# Patient Record
Sex: Female | Born: 1955 | Race: White | Hispanic: No | Marital: Married | Smoking: Never smoker
Health system: Southern US, Community
[De-identification: ages and names within clinical notes are randomized; demographics above are authoritative.]

## PROBLEM LIST (undated history)

## (undated) DIAGNOSIS — G629 Polyneuropathy, unspecified: Secondary | ICD-10-CM

## (undated) DIAGNOSIS — E669 Obesity, unspecified: Secondary | ICD-10-CM

## (undated) DIAGNOSIS — M797 Fibromyalgia: Secondary | ICD-10-CM

## (undated) DIAGNOSIS — K760 Fatty (change of) liver, not elsewhere classified: Secondary | ICD-10-CM

## (undated) DIAGNOSIS — M1990 Unspecified osteoarthritis, unspecified site: Secondary | ICD-10-CM

## (undated) DIAGNOSIS — I10 Essential (primary) hypertension: Secondary | ICD-10-CM

## (undated) DIAGNOSIS — I4891 Unspecified atrial fibrillation: Secondary | ICD-10-CM

## (undated) DIAGNOSIS — I050 Rheumatic mitral stenosis: Secondary | ICD-10-CM

## (undated) DIAGNOSIS — H8109 Meniere's disease, unspecified ear: Secondary | ICD-10-CM

## (undated) DIAGNOSIS — K5190 Ulcerative colitis, unspecified, without complications: Secondary | ICD-10-CM

## (undated) DIAGNOSIS — I219 Acute myocardial infarction, unspecified: Secondary | ICD-10-CM

## (undated) DIAGNOSIS — G459 Transient cerebral ischemic attack, unspecified: Secondary | ICD-10-CM

## (undated) HISTORY — DX: Fibromyalgia: M79.7

## (undated) HISTORY — DX: Essential (primary) hypertension: I10

## (undated) HISTORY — DX: Fatty (change of) liver, not elsewhere classified: K76.0

## (undated) HISTORY — DX: Unspecified osteoarthritis, unspecified site: M19.90

## (undated) HISTORY — DX: Meniere's disease, unspecified ear: H81.09

## (undated) HISTORY — DX: Unspecified atrial fibrillation: I48.91

## (undated) HISTORY — PX: PACEMAKER PLACEMENT: SHX43

## (undated) HISTORY — DX: Rheumatic mitral stenosis: I05.0

## (undated) HISTORY — PX: PARTIAL HYSTERECTOMY: SHX80

## (undated) HISTORY — DX: Transient cerebral ischemic attack, unspecified: G45.9

## (undated) HISTORY — DX: Acute myocardial infarction, unspecified: I21.9

## (undated) HISTORY — PX: REPLACEMENT TOTAL KNEE BILATERAL: SUR1225

## (undated) HISTORY — DX: Obesity, unspecified: E66.9

## (undated) HISTORY — PX: ESOPHAGOGASTRODUODENOSCOPY: SHX1529

## (undated) HISTORY — PX: ABDOMINAL HYSTERECTOMY: SHX81

## (undated) HISTORY — PX: CHOLECYSTECTOMY: SHX55

## (undated) HISTORY — DX: Ulcerative colitis, unspecified, without complications: K51.90

## (undated) HISTORY — DX: Polyneuropathy, unspecified: G62.9

---
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
## 2011-01-23 ENCOUNTER — Emergency Department: Payer: Self-pay | Admitting: Emergency Medicine

## 2011-02-11 ENCOUNTER — Ambulatory Visit: Payer: Self-pay | Admitting: Cardiovascular Disease

## 2011-03-30 ENCOUNTER — Ambulatory Visit: Payer: Self-pay | Admitting: Vascular Surgery

## 2011-04-06 ENCOUNTER — Ambulatory Visit: Payer: Self-pay | Admitting: Internal Medicine

## 2011-04-09 ENCOUNTER — Ambulatory Visit: Payer: Self-pay | Admitting: Internal Medicine

## 2011-07-21 LAB — HM COLONOSCOPY

## 2011-08-14 ENCOUNTER — Ambulatory Visit: Payer: Self-pay | Admitting: Gastroenterology

## 2011-10-15 ENCOUNTER — Ambulatory Visit: Payer: Self-pay | Admitting: Internal Medicine

## 2012-04-25 ENCOUNTER — Ambulatory Visit: Payer: Self-pay | Admitting: Pain Medicine

## 2012-04-28 ENCOUNTER — Ambulatory Visit: Payer: Self-pay | Admitting: Pain Medicine

## 2012-04-28 ENCOUNTER — Other Ambulatory Visit: Payer: Self-pay | Admitting: Pain Medicine

## 2012-04-28 LAB — SEDIMENTATION RATE: Erythrocyte Sed Rate: 25 mm/hr (ref 0–30)

## 2012-06-22 ENCOUNTER — Ambulatory Visit: Payer: Self-pay | Admitting: Pain Medicine

## 2012-07-12 ENCOUNTER — Ambulatory Visit: Payer: Self-pay | Admitting: Pain Medicine

## 2012-07-14 ENCOUNTER — Ambulatory Visit: Payer: Self-pay | Admitting: Internal Medicine

## 2012-07-20 ENCOUNTER — Ambulatory Visit: Payer: Self-pay | Admitting: Internal Medicine

## 2012-08-15 ENCOUNTER — Ambulatory Visit: Payer: Self-pay | Admitting: Pain Medicine

## 2012-08-17 ENCOUNTER — Ambulatory Visit: Payer: Self-pay | Admitting: Gastroenterology

## 2012-09-28 ENCOUNTER — Ambulatory Visit: Payer: Self-pay | Admitting: Pain Medicine

## 2012-09-28 ENCOUNTER — Other Ambulatory Visit: Payer: Self-pay | Admitting: Pain Medicine

## 2012-09-28 LAB — BASIC METABOLIC PANEL
Anion Gap: 5 — ABNORMAL LOW (ref 7–16)
Chloride: 105 mmol/L (ref 98–107)
Co2: 29 mmol/L (ref 21–32)
Glucose: 104 mg/dL — ABNORMAL HIGH (ref 65–99)
Potassium: 4.4 mmol/L (ref 3.5–5.1)

## 2012-10-24 ENCOUNTER — Ambulatory Visit: Payer: Self-pay | Admitting: Pain Medicine

## 2012-11-30 ENCOUNTER — Ambulatory Visit: Payer: Self-pay | Admitting: Physician Assistant

## 2012-12-02 ENCOUNTER — Ambulatory Visit: Payer: Self-pay | Admitting: Pain Medicine

## 2012-12-25 IMAGING — XA IR VASCULAR PROCEDURE
3 series · 6 of 6 positions shown · IV contrast (IODINE)
Comparison: none

[Series 1: care upper arm · 2 of 2 slices shown (1 of 3)]
[im 1/2]
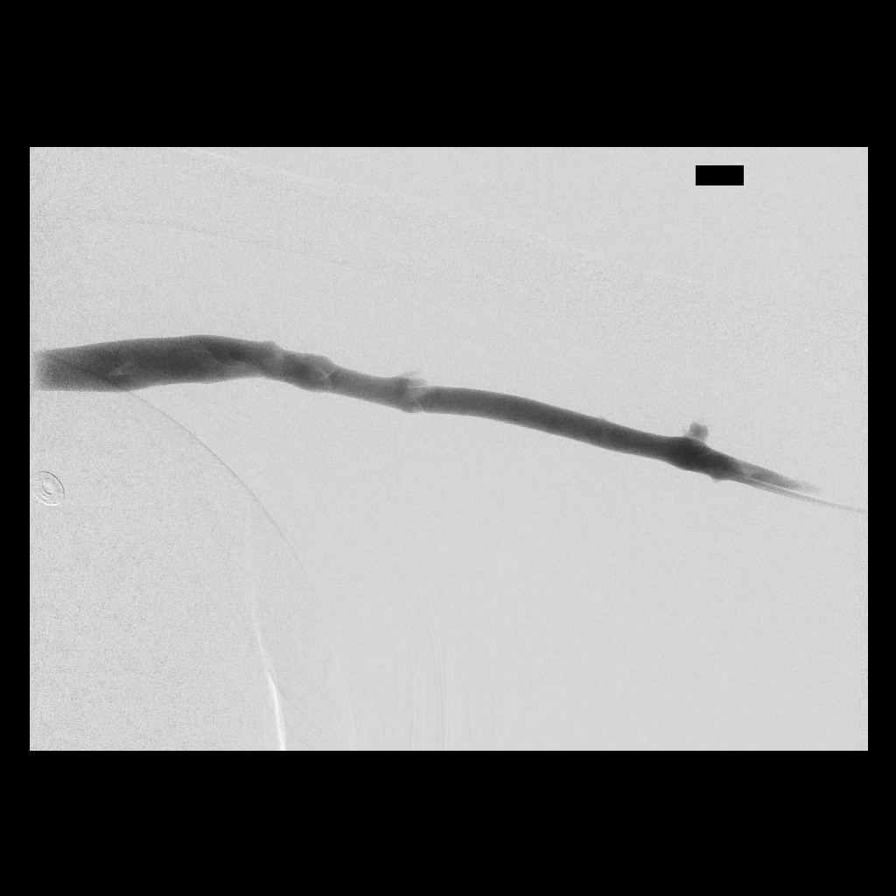
[im 2/2]
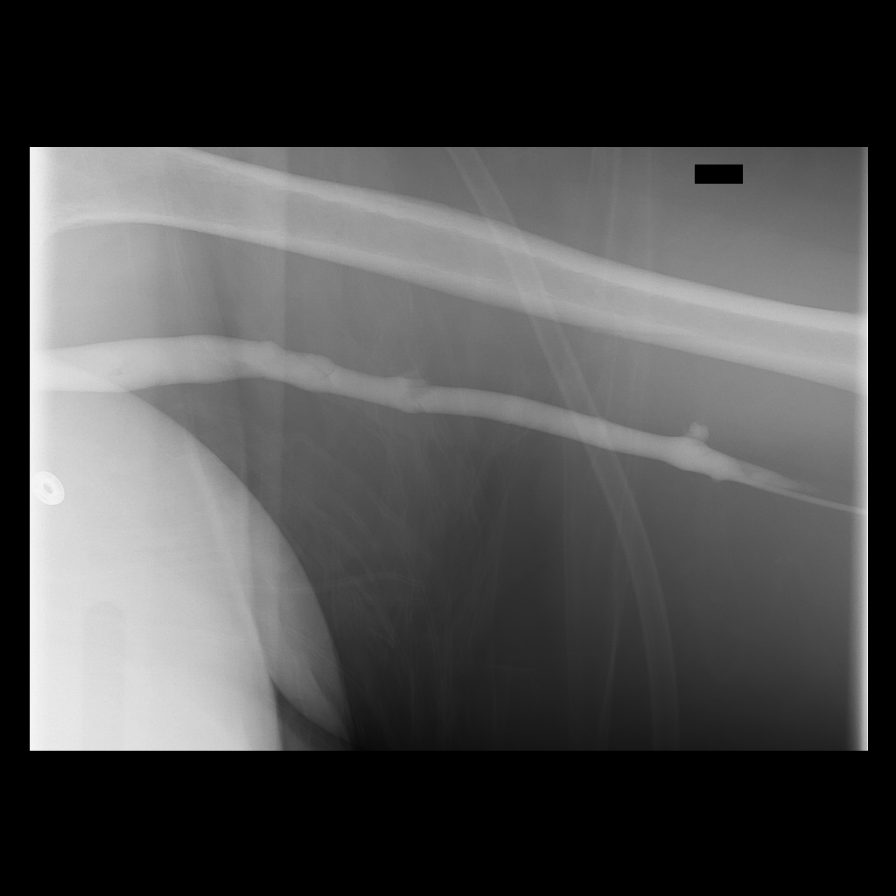

[Series 2: care upper arm · 2 of 2 slices shown (2 of 3)]
[im 1/2]
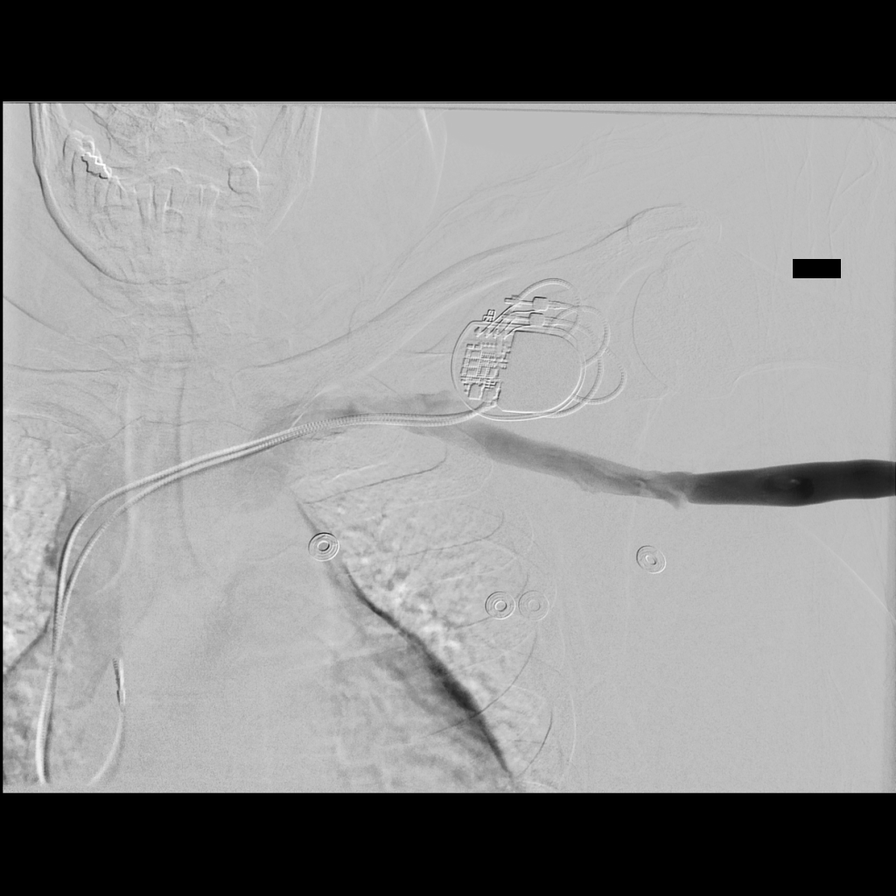
[im 2/2]
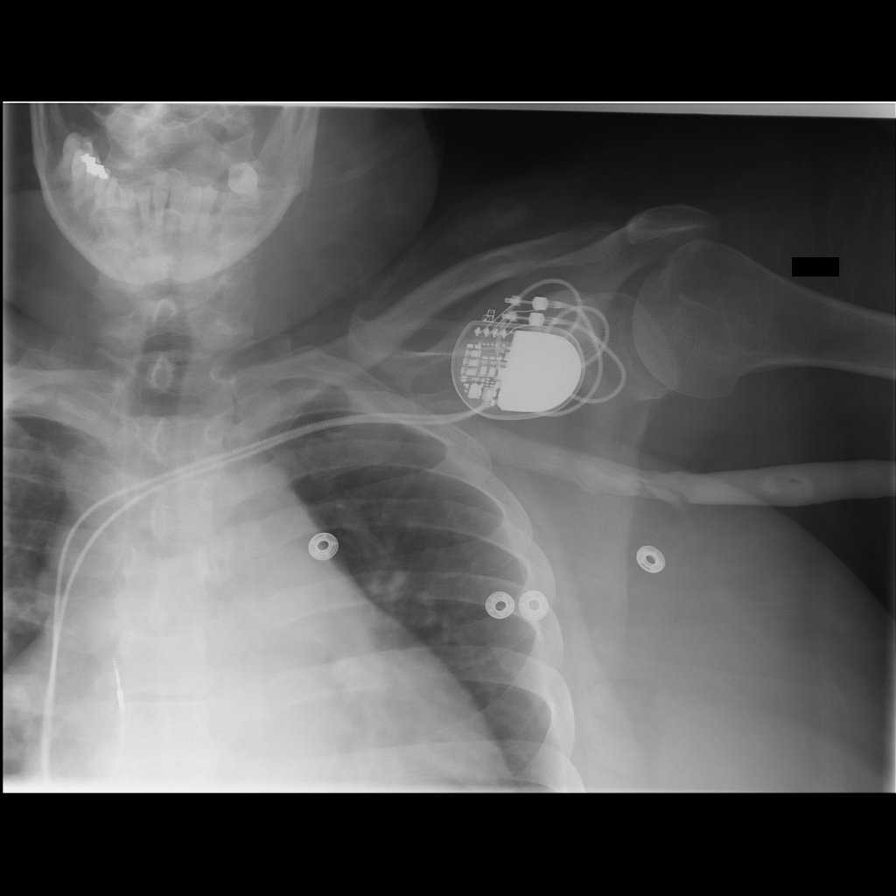

[Series 3: care upper arm · 2 of 2 slices shown (3 of 3)]
[im 1/2]
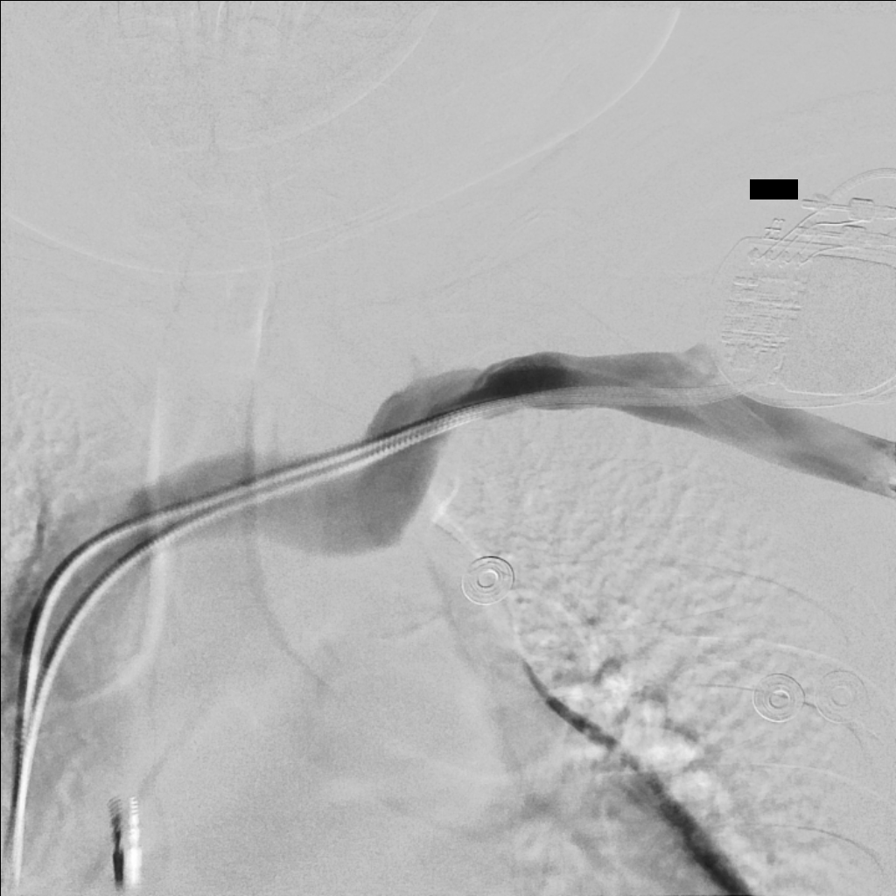
[im 2/2]
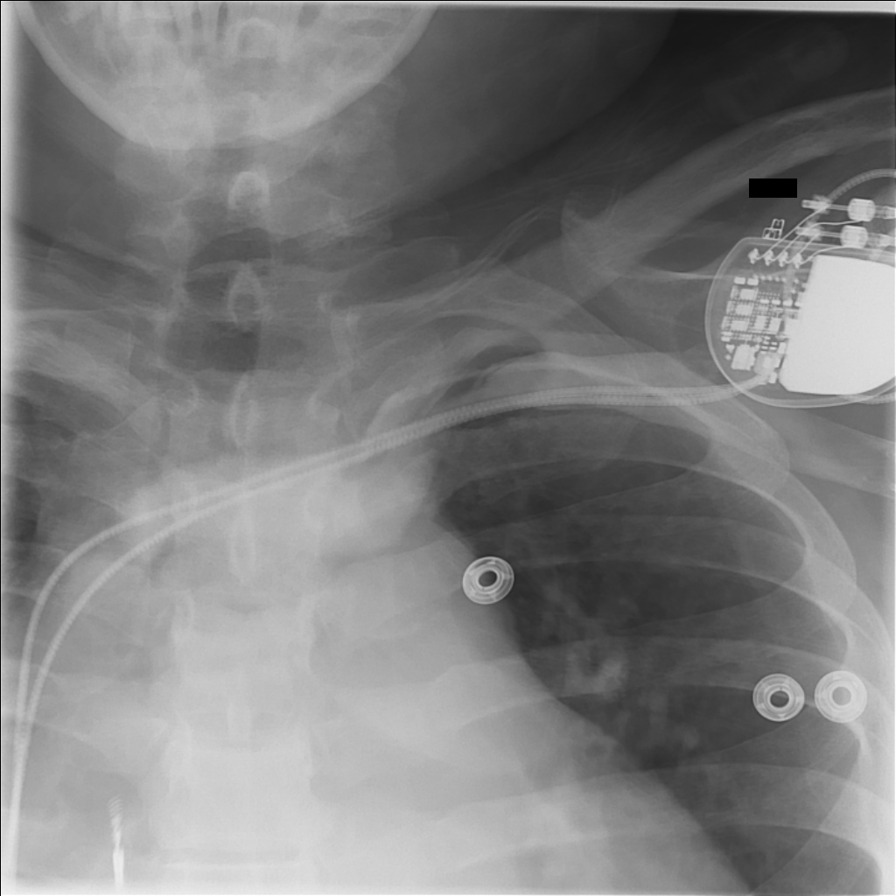

[6 of 6 positions shown; findings below may reference images not displayed]

IMAGES IMPORTED FROM THE [employer] WORKFLOW SYSTEM
NO DICTATION FOR STUDY

## 2013-10-18 ENCOUNTER — Ambulatory Visit: Payer: Self-pay | Admitting: Urgent Care

## 2013-11-15 ENCOUNTER — Ambulatory Visit: Payer: Self-pay | Admitting: Gastroenterology

## 2013-11-16 LAB — PATHOLOGY REPORT

## 2014-11-28 ENCOUNTER — Telehealth: Payer: Self-pay | Admitting: Urgent Care

## 2014-11-28 ENCOUNTER — Other Ambulatory Visit: Payer: Self-pay

## 2014-11-28 DIAGNOSIS — R112 Nausea with vomiting, unspecified: Secondary | ICD-10-CM

## 2014-11-28 MED ORDER — ONDANSETRON 4 MG PO TBDP: 4.0000 mg | ORAL_TABLET | Freq: Four times a day (QID) | ORAL | Status: AC | PRN

## 2014-11-28 NOTE — Telephone Encounter (Signed)
Pt requested zofran.  Not seen in over 1 year Needs 1 yr FU Thanks

## 2014-11-28 NOTE — Telephone Encounter (Signed)
Received refill request from [facility] at this time. Refills sent per last note in [employer].

## 2014-11-29 NOTE — Telephone Encounter (Signed)
Phoned pt and Left her a message with info.

## 2015-03-16 ENCOUNTER — Encounter: Payer: Self-pay | Admitting: *Deleted

## 2015-03-16 ENCOUNTER — Emergency Department
Admission: EM | Admit: 2015-03-16 | Discharge: 2015-03-16 | Disposition: A | Discharge status: Home / Self Care | Payer: Medicaid Other | Attending: Emergency Medicine | Admitting: Emergency Medicine

## 2015-03-16 DIAGNOSIS — N390 Urinary tract infection, site not specified: Secondary | ICD-10-CM | POA: Insufficient documentation

## 2015-03-16 DIAGNOSIS — R300 Dysuria: Secondary | ICD-10-CM | POA: Diagnosis present

## 2015-03-16 HISTORY — DX: Unspecified osteoarthritis, unspecified site: M19.90

## 2015-03-16 LAB — BASIC METABOLIC PANEL
ANION GAP: 8 (ref 5–15)
BUN: 14 mg/dL (ref 6–20)
CALCIUM: 9.3 mg/dL (ref 8.9–10.3)
CO2: 27 mmol/L (ref 22–32)
Chloride: 106 mmol/L (ref 101–111)
Creatinine, Ser: 0.75 mg/dL (ref 0.44–1.00)
GLUCOSE: 93 mg/dL (ref 65–99)
POTASSIUM: 4.3 mmol/L (ref 3.5–5.1)
Sodium: 141 mmol/L (ref 135–145)

## 2015-03-16 LAB — URINALYSIS COMPLETE WITH MICROSCOPIC (ARMC ONLY)
Bilirubin Urine: NEGATIVE
Glucose, UA: NEGATIVE mg/dL
Ketones, ur: NEGATIVE mg/dL
Nitrite: NEGATIVE
PH: 5 (ref 5.0–8.0)
Protein, ur: 30 mg/dL — AB
Specific Gravity, Urine: 1.012 (ref 1.005–1.030)

## 2015-03-16 LAB — CBC
HCT: 43.3 % (ref 35.0–47.0)
Hemoglobin: 14.6 g/dL (ref 12.0–16.0)
MCH: 29.3 pg (ref 26.0–34.0)
MCHC: 33.8 g/dL (ref 32.0–36.0)
MCV: 86.8 fL (ref 80.0–100.0)
PLATELETS: 264 10*3/uL (ref 150–440)
RBC: 4.99 MIL/uL (ref 3.80–5.20)
RDW: 14 % (ref 11.5–14.5)
WBC: 12.8 10*3/uL — AB (ref 3.6–11.0)

## 2015-03-16 MED ORDER — CIPROFLOXACIN HCL 500 MG PO TABS: 500.0000 mg | ORAL_TABLET | Freq: Two times a day (BID) | ORAL | Status: DC

## 2015-03-16 MED ORDER — CEFTRIAXONE SODIUM 1 G IJ SOLR
1.0000 g | Freq: Once | INTRAMUSCULAR | Status: AC
  Administered 2015-03-16: 1 g via INTRAMUSCULAR
  Filled 2015-03-16: qty 10

## 2015-03-16 MED ORDER — HYDROCODONE-ACETAMINOPHEN 5-325 MG PO TABS
1.0000 | ORAL_TABLET | Freq: Once | ORAL | Status: AC
  Administered 2015-03-16: 1 via ORAL
  Filled 2015-03-16: qty 1

## 2015-03-16 MED ORDER — LIDOCAINE HCL (PF) 1 % IJ SOLN
INTRAMUSCULAR | Status: AC
  Filled 2015-03-16: qty 5

## 2015-03-16 NOTE — Discharge Instructions (Signed)

## 2015-03-16 NOTE — ED Notes (Signed)
Pt arrived to ED reporting low grade fever, chills, diarrhea, nausea, vaginal bleeding, and burning with urination since Thursday. Pt also reports swelling and throbbing in left arm beginning yesterday. Pt had pacemaker placed 4 years ago.

## 2015-03-16 NOTE — ED Provider Notes (Addendum)
[facility] Emergency Department Provider Note  ____________________________________________  Time seen: On arrival  I have reviewed the triage vital signs and the nursing notes.   HISTORY  Chief Complaint Blood in urine   HPI Jordan Hamilton is a 59 y.o. female who presents with dysuria since Thursday morning. She reports she developed frequency and dysuria on Thursday so she drank cranberry juice and plenty of fluids to try to flush it out and reported she felt somewhat better on Friday. However this morning she reported worsening suprapubic cramping sensation and dysuria and noted blood when she wiped. Patient has had a hysterectomy. She denies fevers to me and she denies chills to me. No back pain     Past Medical History  Diagnosis Date  . Arthritis     knees, back, hands and neck    There are no active problems to display for this patient.   Past Surgical History  Procedure Laterality Date  . Replacement total knee bilateral    . Cholecystectomy    . Partial hysterectomy    . Pacemaker placement      Current Outpatient Rx  Name  Route  Sig  Dispense  Refill  . ondansetron (ZOFRAN-ODT) 4 MG disintegrating tablet   Oral   Take 1 tablet (4 mg total) by mouth every 6 (six) hours as needed for nausea or vomiting.   30 tablet   3     Allergies Erythromycin  History reviewed. No pertinent family history.  Social History Social History  Substance Use Topics  . Smoking status: Never Smoker   . Smokeless tobacco: None  . Alcohol Use: Yes    Review of Systems  Constitutional: Negative for fever. Eyes: Negative for visual changes. ENT: Negative for sore throat Cardiovascular: Negative for chest pain. Respiratory: Negative for shortness of breath. Gastrointestinal: Negative for diarrhea Genitourinary: Positive for dysuria Musculoskeletal: Negative for back pain. Skin: Negative for rash. Neurological: Negative for headaches or  focal weakness Psychiatric: No anxiety    ____________________________________________   PHYSICAL EXAM:  VITAL SIGNS: ED Triage Vitals  Enc Vitals Group     BP 03/16/15 1754 120/61 mmHg     Pulse Rate 03/16/15 1754 73     Resp 03/16/15 1754 16     Temp 03/16/15 1754 98.5 F (36.9 C)     Temp Source 03/16/15 1754 Oral     SpO2 03/16/15 1754 94 %     Weight 03/16/15 1754 220 lb (99.791 kg)     Height 03/16/15 1754 5' 4.5" (1.638 m)     Head Cir --      Peak Flow --      Pain Score 03/16/15 1750 5     Pain Loc --      Pain Edu? --      Excl. in GC? --      Constitutional: Alert and oriented. Well appearing and in no distress. Eyes: Conjunctivae are normal.  ENT   Head: Normocephalic and atraumatic.   Mouth/Throat: Mucous membranes are moist. Cardiovascular: Normal rate, regular rhythm. Normal and symmetric distal pulses are present in all extremities. No murmurs, rubs, or gallops. Respiratory: Normal respiratory effort without tachypnea nor retractions. Breath sounds are clear and equal bilaterally.  Gastrointestinal: Soft and non-tender in all quadrants. No distention. There is no CVA tenderness. Genitourinary: deferred Musculoskeletal: Nontender with normal range of motion in all extremities. No lower extremity tenderness nor edema. Neurologic:  Normal speech and language. No gross focal neurologic  deficits are appreciated. Skin:  Skin is warm, dry and intact. No rash noted. Psychiatric: Mood and affect are normal. Patient exhibits appropriate insight and judgment.  ____________________________________________    LABS (pertinent positives/negatives)  Labs Reviewed  URINALYSIS COMPLETEWITH MICROSCOPIC ([facility] ONLY) - Abnormal; Notable for the following:    Color, Urine YELLOW (*)    APPearance CLOUDY (*)    Hgb urine dipstick 3+ (*)    Protein, ur 30 (*)    Leukocytes, UA 3+ (*)    Bacteria, UA RARE (*)    Squamous Epithelial / LPF 0-5 (*)    All other  components within normal limits    ____________________________________________   EKG  ED ECG REPORT I, Jene Every, the attending physician, personally viewed and interpreted this ECG.   Date: 03/16/2015  EKG Time: 6:07 PM  Rate: 73  Rhythm: atrial fibrillation, rate 73  Axis: Normal  Intervals: Abnormal  ST&T Change: Nonspecific   ____________________________________________    RADIOLOGY I have personally reviewed any xrays that were ordered on this patient: None  ____________________________________________   PROCEDURES  Procedure(s) performed: none  Critical Care performed: none  ____________________________________________   INITIAL IMPRESSION / ASSESSMENT AND PLAN / ED COURSE  Pertinent labs & imaging results that were available during my care of the patient were reviewed by me and considered in my medical decision making (see chart for details).  Patient well-appearing and in no distress. Her history of present illness is consistent with a urinary tract infection. We will check the urine and blood tests  Urinalysis consistent with UTI, we will give IM Rocephin given that she is a very difficult stick. Mild elevation in white blood cell count but vitals are unremarkable and she is appropriate for outpatient treatment. Patient agrees to return if any change in her symptoms.  ____________________________________________   FINAL CLINICAL IMPRESSION(S) / ED DIAGNOSES  Final diagnoses:  UTI (lower urinary tract infection)     Jene Every, MD 03/16/15 2012  Jene Every, MD 03/16/15 2013

## 2015-03-27 ENCOUNTER — Encounter: Payer: Self-pay | Admitting: Unknown Physician Specialty

## 2015-03-27 ENCOUNTER — Ambulatory Visit (INDEPENDENT_AMBULATORY_CARE_PROVIDER_SITE_OTHER): Payer: Medicaid Other | Admitting: Unknown Physician Specialty

## 2015-03-27 VITALS — BP 111/80 | HR 93 | Temp 98.1°F | Ht 63.0 in | Wt 263.9 lb

## 2015-03-27 DIAGNOSIS — R7989 Other specified abnormal findings of blood chemistry: Secondary | ICD-10-CM

## 2015-03-27 DIAGNOSIS — K760 Fatty (change of) liver, not elsewhere classified: Secondary | ICD-10-CM | POA: Insufficient documentation

## 2015-03-27 DIAGNOSIS — K589 Irritable bowel syndrome without diarrhea: Secondary | ICD-10-CM | POA: Insufficient documentation

## 2015-03-27 DIAGNOSIS — Z23 Encounter for immunization: Secondary | ICD-10-CM

## 2015-03-27 DIAGNOSIS — R1030 Lower abdominal pain, unspecified: Secondary | ICD-10-CM | POA: Diagnosis not present

## 2015-03-27 DIAGNOSIS — T782XXD Anaphylactic shock, unspecified, subsequent encounter: Secondary | ICD-10-CM

## 2015-03-27 DIAGNOSIS — I213 ST elevation (STEMI) myocardial infarction of unspecified site: Secondary | ICD-10-CM

## 2015-03-27 DIAGNOSIS — Z7901 Long term (current) use of anticoagulants: Secondary | ICD-10-CM

## 2015-03-27 DIAGNOSIS — M069 Rheumatoid arthritis, unspecified: Secondary | ICD-10-CM | POA: Insufficient documentation

## 2015-03-27 DIAGNOSIS — Z87440 Personal history of urinary (tract) infections: Secondary | ICD-10-CM | POA: Diagnosis not present

## 2015-03-27 DIAGNOSIS — R300 Dysuria: Secondary | ICD-10-CM

## 2015-03-27 DIAGNOSIS — T782XXA Anaphylactic shock, unspecified, initial encounter: Secondary | ICD-10-CM | POA: Insufficient documentation

## 2015-03-27 DIAGNOSIS — I4891 Unspecified atrial fibrillation: Secondary | ICD-10-CM | POA: Insufficient documentation

## 2015-03-27 DIAGNOSIS — G8929 Other chronic pain: Secondary | ICD-10-CM

## 2015-03-27 DIAGNOSIS — I219 Acute myocardial infarction, unspecified: Secondary | ICD-10-CM | POA: Insufficient documentation

## 2015-03-27 DIAGNOSIS — G459 Transient cerebral ischemic attack, unspecified: Secondary | ICD-10-CM

## 2015-03-27 LAB — MICROSCOPIC EXAMINATION

## 2015-03-27 LAB — UA/M W/RFLX CULTURE, ROUTINE
Bilirubin, UA: NEGATIVE
Glucose, UA: NEGATIVE
Ketones, UA: NEGATIVE
LEUKOCYTES UA: NEGATIVE
Nitrite, UA: NEGATIVE
PH UA: 5 (ref 5.0–7.5)
PROTEIN UA: NEGATIVE
Specific Gravity, UA: >1.03 — ABNORMAL HIGH (ref 1.005–1.030)
Urobilinogen, Ur: 0.2 mg/dL (ref 0.2–1.0)

## 2015-03-27 MED ORDER — EPINEPHRINE 0.3 MG/0.3ML IJ SOAJ: 0.3000 mg | Freq: Once | INTRAMUSCULAR | Status: AC

## 2015-03-27 NOTE — Assessment & Plan Note (Signed)
To bee stings.  Refilled Epi pen

## 2015-03-27 NOTE — Assessment & Plan Note (Addendum)
I discussed with her that I cannot fill her chronic pain medications as 2 providers should not be involved and she does seem lost to f/u here.  Discussed with Dr. Sherie Don who agrees with the plan of care

## 2015-03-27 NOTE — Progress Notes (Signed)
++++++++--------------------------------------------+-  BP 111/80 mmHg  Pulse 93  Temp(Src) 98.1 F (36.7 C)  Ht 5\' 3"  (1.6 m)  Wt 263 lb 14.4 oz (119.704 kg)  BMI 46.76 kg/m2  SpO2 97%   Subjective:    Patient ID: , female    DOB: [birth year], 59 y.o.   MRN: [id number]  HPI: Jordan Hamilton is a 59 y.o. female  Chief Complaint  Patient presents with  . Urinary Tract Infection    pt states she was seen in ER for UTI the other week, states it is not gone  . Medication Problem    pt states she wants to talk about pain medications, enbrel pen, and a prescription for bee venom   Went to the ER on the 22nd for a UTI with an elevated WBC.  She got Rocephin in the ER and was given Cipro 500 mg twice a day for seven days.  She feels it is not gone and having "a little bit of pelvic pressure and a low grade fever "once in a while."  I have reviewed the notes from the ER.    While here she is holding a pen of Embrel and wondering if it was malfunctioning.  She does have a Rheumatologist and prescriber for her pain medications.   She takes Tramadol and Percocet and only has "3 left" and asking if I can fill them as she is not seeing the .   She aw Dr. 23 once over a year ago and has not been seen back.  She takes 1 Percocet 4 times/day and takes 2-4 Tramadol depending on level.  Review of controlled substance report doesn't seem to confirm a refill rate of Percocet 4 times a day with last refill of 30 given on 7/2 by Dr. 9/2  Needs an Epi pen for bee stings.    Relevant past medical, surgical, family and social history reviewed and updated as indicated. Interim medical history since our last visit reviewed. Allergies and medications reviewed and updated.  Review of Systems  Per HPI unless specifically indicated above     Objective:    BP 111/80 mmHg  Pulse 93  Temp(Src) 98.1 F (36.7 C)  Ht 5\' 3"  (1.6 m)  Wt 263 lb 14.4 oz (119.704 kg)  BMI 46.76 kg/m2  SpO2 97%   Wt Readings from Last 3 Encounters:  03/27/15 263 lb 14.4 oz (119.704 kg)  10/25/13 262 lb (118.842 kg)  03/16/15 220 lb (99.791 kg)    Physical Exam  Constitutional: She is oriented to person, place, and time. She appears well-developed and well-nourished. No distress.  HENT:  Head: Normocephalic and atraumatic.  Eyes: Conjunctivae and lids are normal. Right eye exhibits no discharge. Left eye exhibits no discharge. No scleral icterus.  Cardiovascular: Normal rate, regular rhythm and normal heart sounds.   Pulmonary/Chest: Effort normal and breath sounds normal. No respiratory distress.  Abdominal: Normal appearance and bowel sounds are normal. She exhibits no distension. There is no splenomegaly or hepatomegaly. There is no tenderness.  Musculoskeletal: Normal range of motion.  Neurological: She is alert and oriented to person, place, and time.  Skin: Skin is intact. No rash noted. No pallor.  Psychiatric: She has a normal mood and affect. Her behavior is normal. Judgment and thought content normal.  Nursing note reviewed. +    Assessment & Plan:   Problem List Items Addressed This Visit      Unprioritized   Chronic pain    I  discussed with her that I cannot fill her chronic pain medications as 2 providers should not be involved and she does seem lost to f/u here.  Discussed with Dr. Sherie Don who agrees with the plan of care      Anaphylaxis    To bee stings.  Refilled Epi pen       Other Visit Diagnoses    Immunization due    -  Primary    Relevant Orders    Flu Vaccine QUAD 36+ mos IM (Completed)    Lower abdominal pain        Having pressure.  Urine reviewed and normal.  Will send for a culture and sensitivity.      Relevant Orders    UA/M w/rflx Culture, Routine    Urine Culture    Dysuria        Relevant Orders    Urine Culture    History of UTI        Relevant Orders    Urine Culture        Follow up plan: Return for Needs to see Dr. Sherie Don for f/u.

## 2015-03-28 ENCOUNTER — Telehealth: Payer: Self-pay | Admitting: Family Medicine

## 2015-03-28 NOTE — Telephone Encounter (Signed)
Patient came in yesterday and saw Elnita Maxwell for a UTI. Elnita Maxwell told her there was a little bit of blood in her urine but that it looked basically normal. Patient states she is still having pain and pressure and feels like she still has a UTI (she was seen in the ED and given 7 days worth of antibiotics before) and wants to know if there is any way she can get another weeks worth of antibiotics.

## 2015-03-28 NOTE — Telephone Encounter (Signed)
I reviewed urine done yesterday during visit with Jordan Hamilton, culture pending I reviewed urine done in ER; she was treated with cipro for 7 days I called pt, left message that our culture is still pending; will certainly be looking for that, but urine yesterday did not appear to be grossly infected I'm here Friday if she'd like to make an appt (I've not seen her in over a year); no new antibiotics right now, culture is pending

## 2015-03-28 NOTE — Telephone Encounter (Signed)
Pt called stated she would like a call back from Amy ASAP today. Thanks.

## 2015-03-29 LAB — URINE CULTURE

## 2015-03-29 NOTE — Addendum Note (Signed)
Addended by: Gabriel Cirri on: 03/29/2015 12:07 PM   Modules accepted: Kipp Brood

## 2015-03-29 NOTE — Addendum Note (Signed)
Addended by: Bayard Hugger on: 03/29/2015 11:35 AM   Modules accepted: Kipp Brood

## 2015-07-16 IMAGING — CT CT ABD-PELV W/ CM
2 of 6 series · 17 of 46 positions shown, 19 images · IV contrast (isovue)
Comparison: Lumbar spine CT 04/28/2012

CLINICAL DATA: Abdominal pain and vomiting for 1 year, worse in the
last 6 months. Epigastric pain. Weight loss.

EXAM:
CT ABDOMEN AND PELVIS WITH CONTRAST
TECHNIQUE: Multidetector CT imaging of the abdomen and pelvis was performed
using the standard protocol following bolus administration of
intravenous contrast.
CONTRAST:  125 mL Isovue 370

[Series 2: routine with · axial · 0.84mm/px · z∈[-990,-620]mm · 14 of 86 slices shown, 16 images]
[im 6/86  soft-tissue]
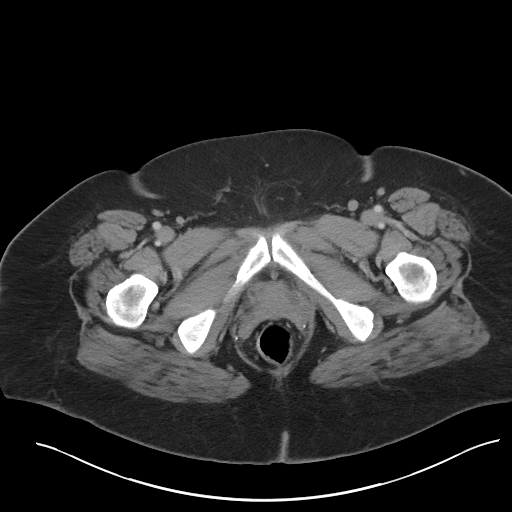
[im 6/86  bone]
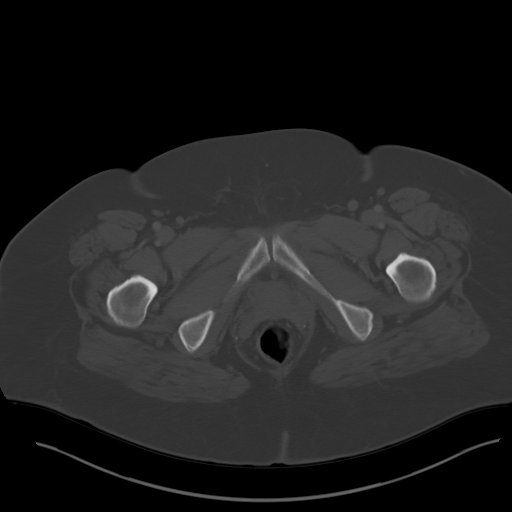
[im 12/86  soft-tissue]
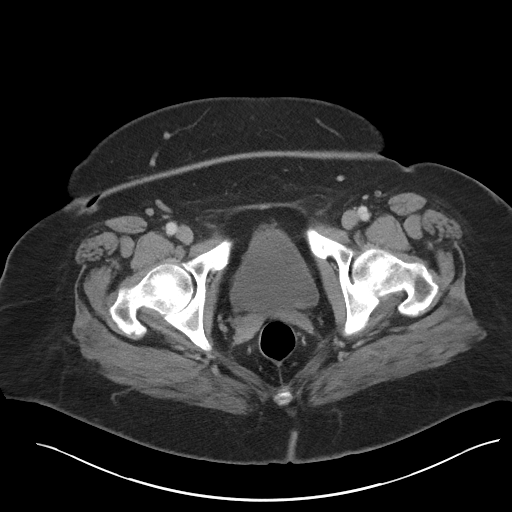
[im 18/86  soft-tissue]
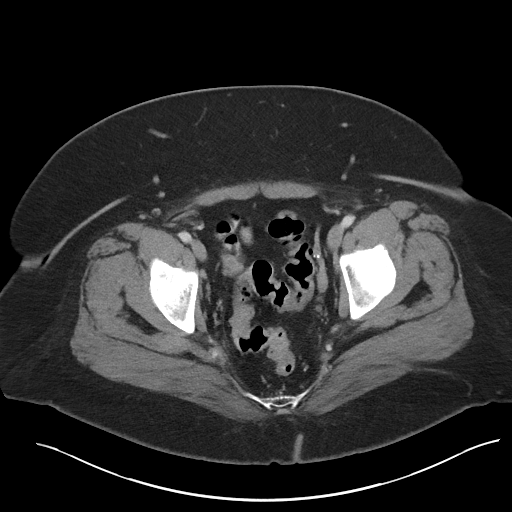
[im 23/86  soft-tissue]
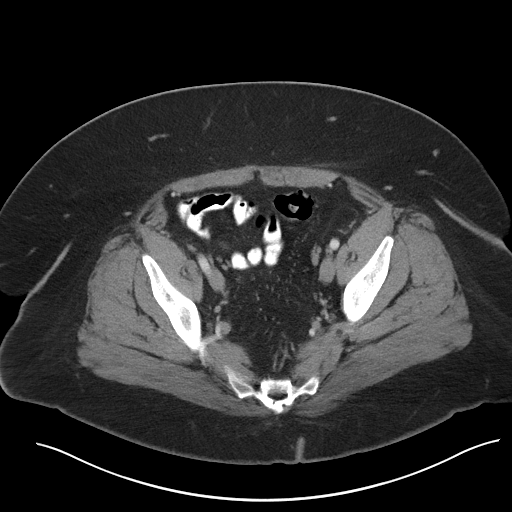
[im 29/86  soft-tissue]
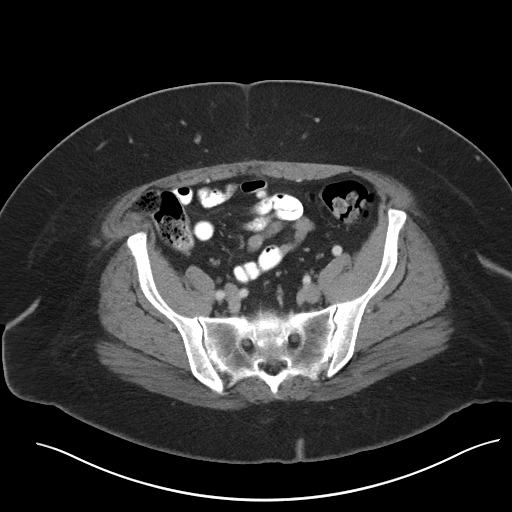
[im 35/86  soft-tissue]
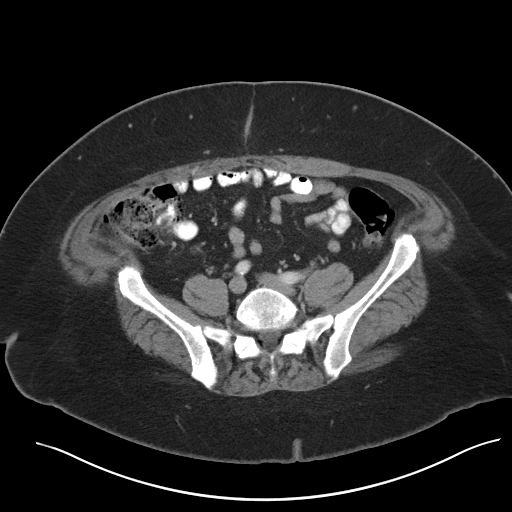
[im 40/86  soft-tissue]
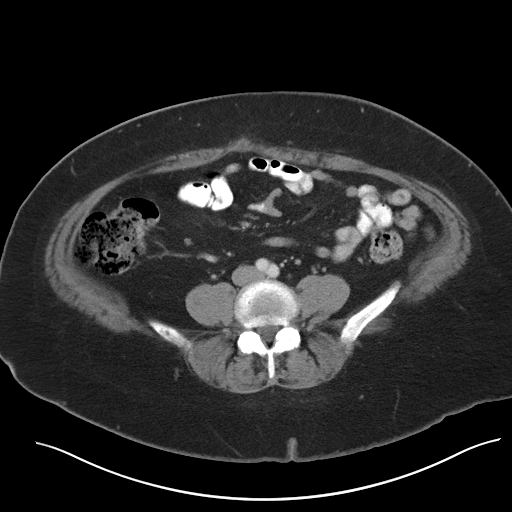
[im 46/86  soft-tissue]
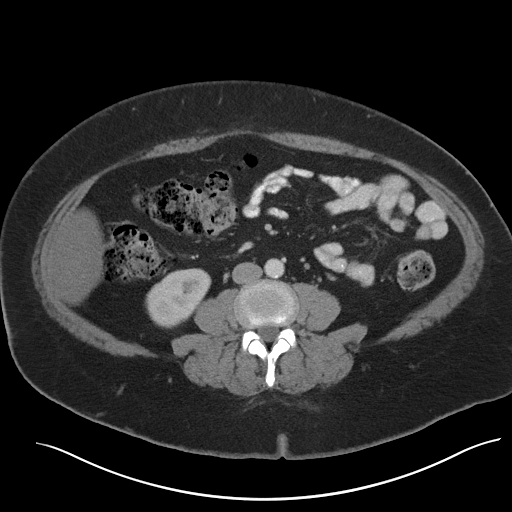
[im 52/86  soft-tissue]
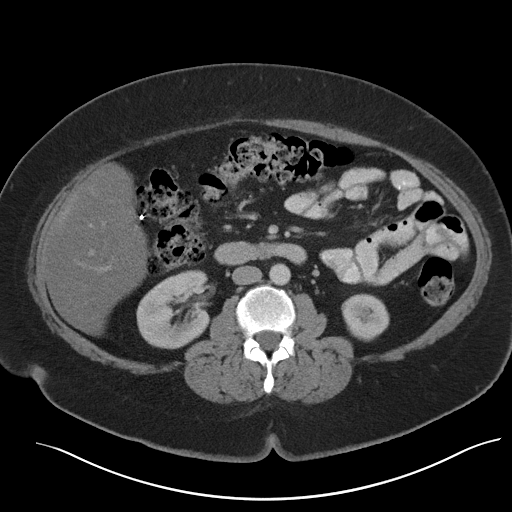
[im 52/86  bone]
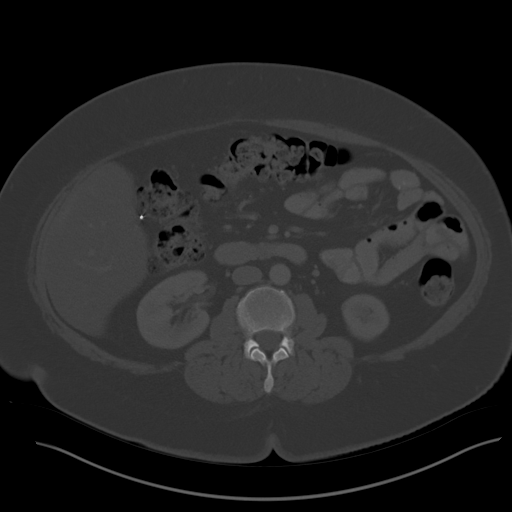
[im 57/86  soft-tissue]
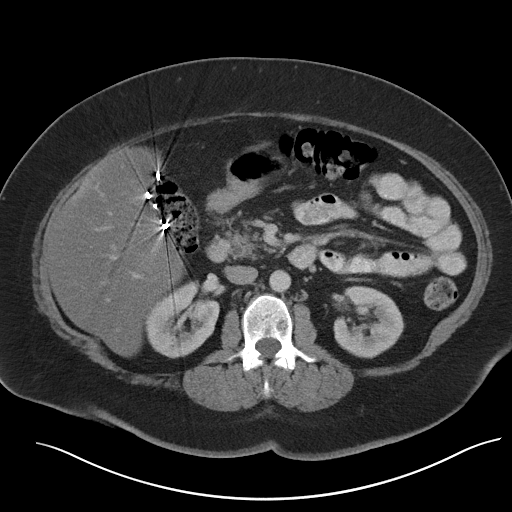
[im 63/86  soft-tissue]
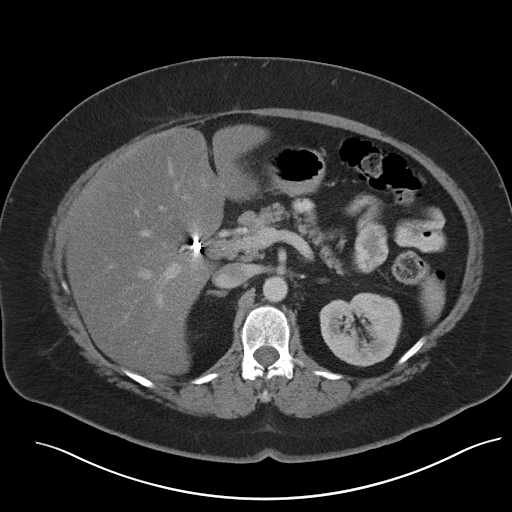
[im 69/86  soft-tissue]
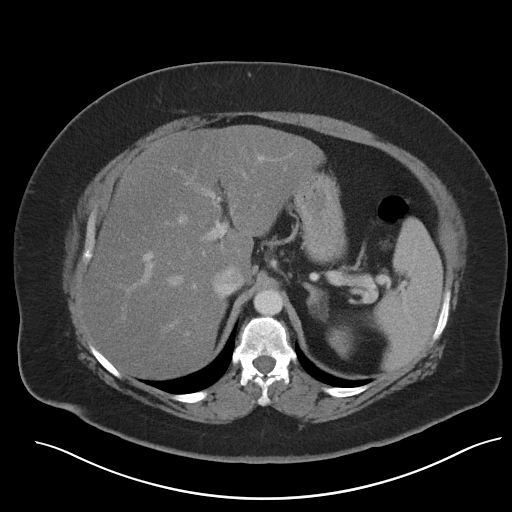
[im 74/86  soft-tissue]
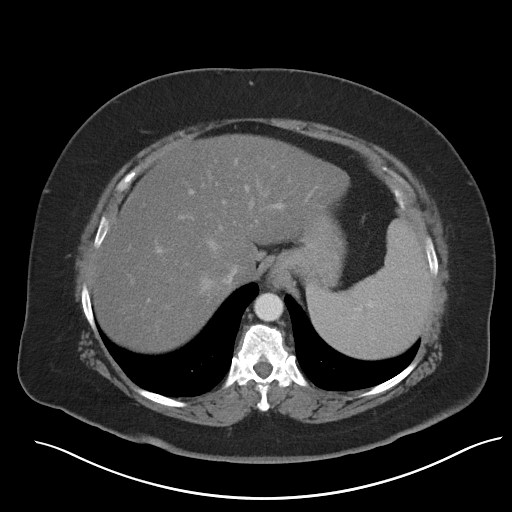
[im 80/86  soft-tissue]
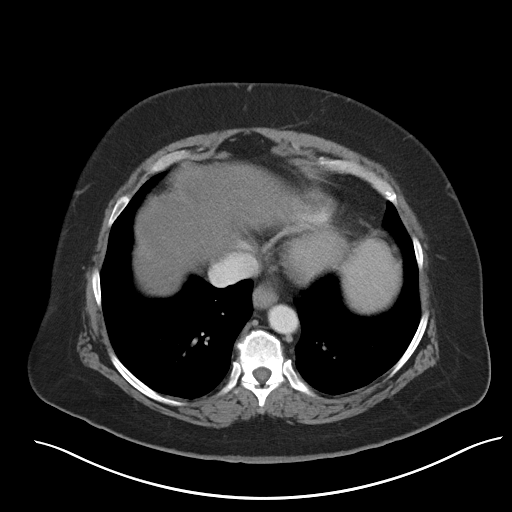

[Series 8: cor routine with · coronal · 0.84mm/px · 3 of 161 slices shown]
[im 54/161  soft-tissue]
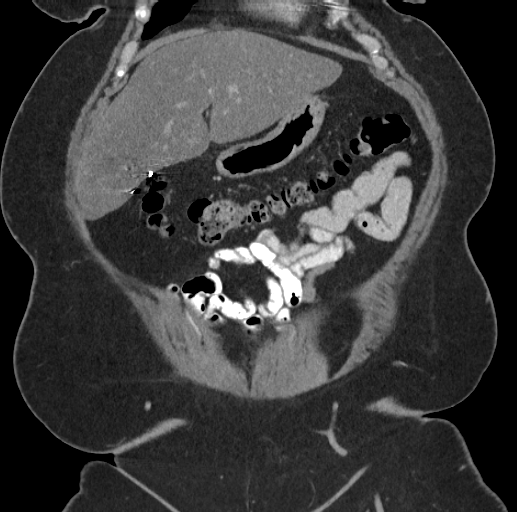
[im 72/161  soft-tissue]
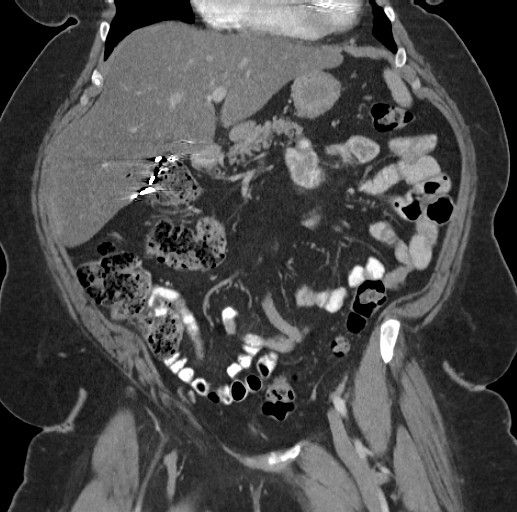
[im 89/161  soft-tissue]
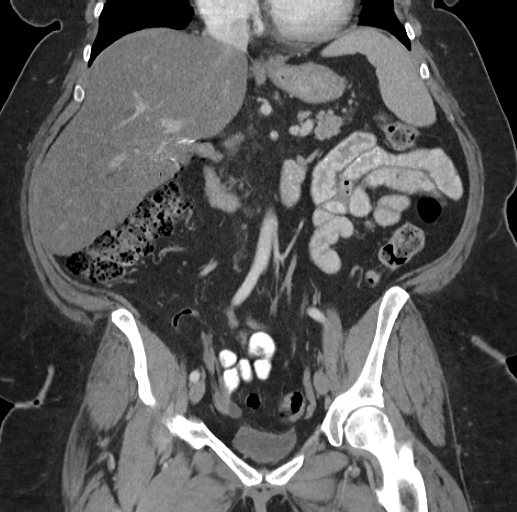

[17 of 46 positions shown; findings below may reference images not displayed]

FINDINGS: Visualized lung bases are clear. Pacemaker lead is partially
visualized in the right ventricle.

The liver is diffusely low in attenuation, consistent with moderate
steatosis. The gallbladder is surgically absent. The spleen, right
adrenal gland, kidneys, and pancreas have an unremarkable enhanced
appearance. There is slight thickening of the left adrenal gland.

There is a small sliding hiatal hernia. Oral contrast is present in
nondilated loops of small bowel without evidence obstruction. The
colon is nondilated. Appendix is identified in the right lower
quadrant and is unremarkable. Scattered colonic diverticula are
noted without evidence of diverticulitis.

No free fluid or enlarged lymph nodes are identified. Minimal
atherosclerotic aortic calcification is noted. The uterus is absent.
The ovaries are identified, with a 10 mm cyst noted associated with
the right ovary. The bladder is unremarkable. Heterogeneous
sclerosis and overgrowth involving the posterior right ilium is
unchanged.
IMPRESSION: 1. No acute abnormality or mass identified in the abdomen or pelvis.
2. Hepatic steatosis.
3. Small hiatal hernia.

## 2015-11-21 ENCOUNTER — Telehealth: Payer: Self-pay

## 2015-11-21 NOTE — Telephone Encounter (Signed)
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  Pt states that her rheumatologist is requesting that she have a PORT A CATH placed while she is in [place] and requested that I contact Lupita Leash at [facility] to give NPI number for her to be seen. I contacted Lupita Leash at [phone] and provided her with the NPI number as requested.

## 2016-02-10 ENCOUNTER — Telehealth: Payer: Self-pay | Admitting: Gastroenterology

## 2016-02-10 NOTE — Telephone Encounter (Signed)
Pt will need an appt. Please double book her on our schedule during that time. Since she hasn't been seen in 2 years she will need an appt first. Thanks!

## 2016-02-10 NOTE — Telephone Encounter (Signed)
Last seen 2015. Nauseated again and needs refills. Only in town 10/3 - 10/13  Zofran 4mg  Thenaboz 25mg  Phenergan 25 mg

## 2016-02-27 ENCOUNTER — Other Ambulatory Visit: Payer: Self-pay | Admitting: Unknown Physician Specialty

## 2016-03-13 NOTE — Telephone Encounter (Signed)
Your patient
